# Patient Record
Sex: Female | Born: 2005 | Race: Black or African American | Hispanic: No | Marital: Single | Smoking: Never smoker
Health system: Southern US, Community
[De-identification: ages and names within clinical notes are randomized; demographics above are authoritative.]

## PROBLEM LIST (undated history)

## (undated) DIAGNOSIS — J45909 Unspecified asthma, uncomplicated: Secondary | ICD-10-CM

## (undated) DIAGNOSIS — Z87898 Personal history of other specified conditions: Secondary | ICD-10-CM

## (undated) DIAGNOSIS — Z820 Family history of epilepsy and other diseases of the nervous system: Secondary | ICD-10-CM

## (undated) HISTORY — DX: Family history of epilepsy and other diseases of the nervous system: Z82.0

## (undated) HISTORY — DX: Unspecified asthma, uncomplicated: J45.909

## (undated) HISTORY — DX: Personal history of other specified conditions: Z87.898

---
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
## 2023-04-21 NOTE — Progress Notes (Deleted)
NEUROLOGY CONSULTATION NOTE  Olivia Steele MRN: [id number] DOB: [birth year]  Referring provider: Elsie Saas, MD Primary care provider: [facility]  Reason for consult:  headache  Assessment/Plan:   ***   Subjective:  Olivia Steele is a 17 year old ***-handed female with asthma, allergic rhinitis and ADHD who presents for headaches.  History supplemented by referring provider's note.  Onset:  *** Location:  *** Quality:  *** Intensity:  ***.  *** denies new headache, thunderclap headache or severe headache that wakes *** from sleep. Aura:  *** Prodrome:  *** Postdrome:  *** Associated symptoms:  ***.  *** denies associated unilateral numbness or weakness. Duration:  *** Frequency:  *** Frequency of abortive medication: *** Triggers:  *** Relieving factors:  *** Activity:  ***  Past NSAIDS/analgesics:  *** Past abortive triptans:  *** Past abortive ergotamine:  *** Past muscle relaxants:  *** Past anti-emetic:  *** Past antihypertensive medications:  *** Past antidepressant medications:  *** Past anticonvulsant medications:  *** Past anti-CGRP:  *** Past vitamins/Herbal/Supplements:  *** Past antihistamines/decongestants:  *** Other past therapies:  ***  Current NSAIDS/analgesics:  *** Current triptans:  *** Current ergotamine:  *** Current anti-emetic:  *** Current muscle relaxants:  *** Current Antihypertensive medications:  *** Current Antidepressant medications:  *** Current Anticonvulsant medications:  *** Current anti-CGRP:  *** Current Vitamins/Herbal/Supplements:  *** Current Antihistamines/Decongestants:  levocetirizine, Flonase Other therapy:  *** Birth control:  *** Other medications:  ***   Caffeine:  *** Alcohol:  *** Smoker:  *** Diet:  *** Exercise:  *** Depression:  ***; Anxiety:  *** Other pain:  *** Sleep hygiene:  ***  Past history:  She did fall and hit her head when she was [age] years old in January 2007.   CT head at that time, personally reviewed, was normal.  Family history of headache:  ***      PAST MEDICAL HISTORY: Past Medical History:  Diagnosis Date   Asthma     PAST SURGICAL HISTORY: No past surgical history on file.  MEDICATIONS: Current Outpatient Medications on File Prior to Visit  Medication Sig Dispense Refill   norgestimate-ethinyl estradiol (ORTHO-CYCLEN) 0.25-35 MG-MCG tablet Take 1 tablet by mouth daily. 1 Package 11   No current facility-administered medications on file prior to visit.    ALLERGIES: No Known Allergies  FAMILY HISTORY: Family History  Problem Relation Age of Onset   Diabetes Mother    Diabetes Maternal Grandmother     Objective:  *** General: No acute distress.  Patient appears well-groomed.   Head:  Normocephalic/atraumatic Eyes:  fundi examined but not visualized Neck: supple, no paraspinal tenderness, full range of motion Back: No paraspinal tenderness Heart: regular rate and rhythm Lungs: Clear to auscultation bilaterally. Vascular: No carotid bruits. Neurological Exam: Mental status: alert and oriented to person, place, and time, speech fluent and not dysarthric, language intact. Cranial nerves: CN I: not tested CN II: pupils equal, round and reactive to light, visual fields intact CN III, IV, VI:  full range of motion, no nystagmus, no ptosis CN V: facial sensation intact. CN VII: upper and lower face symmetric CN VIII: hearing intact CN IX, X: gag intact, uvula midline CN XI: sternocleidomastoid and trapezius muscles intact CN XII: tongue midline Bulk & Tone: normal, no fasciculations. Motor:  muscle strength 5/5 throughout Sensation:  Pinprick, temperature and vibratory sensation intact. Deep Tendon Reflexes:  2+ throughout,  toes downgoing.   Finger to nose testing:  Without dysmetria.   Heel to shin:  Without dysmetria.   Gait:  Normal station and stride.  Romberg negative.    Thank you for allowing me to take  part in the care of this patient.  Shon Millet, DO  CC: ***

## 2023-04-24 ENCOUNTER — Ambulatory Visit: Payer: Medicaid Other | Admitting: Neurology

## 2023-05-16 ENCOUNTER — Encounter: Payer: Self-pay | Admitting: Neurology

## 2023-07-10 ENCOUNTER — Emergency Department (HOSPITAL_BASED_OUTPATIENT_CLINIC_OR_DEPARTMENT_OTHER)
Admission: EM | Admit: 2023-07-10 | Discharge: 2023-07-10 | Discharge status: Against Medical Advice | Payer: Medicaid Other

## 2023-07-10 DIAGNOSIS — Z20828 Contact with and (suspected) exposure to other viral communicable diseases: Secondary | ICD-10-CM | POA: Diagnosis not present

## 2023-07-10 DIAGNOSIS — J101 Influenza due to other identified influenza virus with other respiratory manifestations: Secondary | ICD-10-CM | POA: Diagnosis not present

## 2023-07-10 DIAGNOSIS — Z20822 Contact with and (suspected) exposure to covid-19: Secondary | ICD-10-CM | POA: Diagnosis not present

## 2023-07-10 NOTE — ED Notes (Signed)
 Pt advised Registration she was leaving

## 2023-08-29 NOTE — Progress Notes (Unsigned)
 NEUROLOGY CONSULTATION NOTE  Olivia Steele MRN: [id number] DOB: 2005/11/24  Referring provider: [facility] Primary care provider: No PCP  Reason for consult:  headache  Assessment/Plan:   Chronic migraine without aura, without status migrainosus, not intractable  Due to having near daily headaches for 1-2 years, check MRI of brain with and without contrast Migraine prevention:  Start topiramate 25mg  at bedtime.  We can increase dose to 50mg  in 4 weeks if needed.  Discussed side effects and advised to take precautions not to get pregnant. Migraine rescue:  Rizatriptan 10mg ; Zofran 4mg .  Stop ibuprofen Limit use of pain relievers to no more than 2 days out of week to prevent risk of rebound or medication-overuse headache. Keep headache diary Consider magnesium citrate, CoQ10, riboflavin Lifestyle modification discussed Follow up 6 months.    Subjective:  Olivia Steele is a 18 year old right-handed female with ADHD and asthma who presents for headaches.  History supplemented by her accompanying mother and referring provider's note.  Onset:  childhood.  Worse since 17 years old, almost daily since 18 years old Location:  diffuse Quality:  pounding Intensity:  9-10/10 Aura:  absent Prodrome:  absent Associated symptoms:  Nausea, photophobia, phonophobia, osmophobia, blurred vision (sometimes sees lights), dizziness, neck pain, weakness.   Duration:  all day Frequency:  20 days a month Frequency of abortive medication: ibuprofen 2-3 times a week Triggers:  unknown Relieving factors:  lay down and rest Activity:  aggravated, able to still function  Past NSAIDS/analgesics:  none Past abortive triptans:  none Past abortive ergotamine:  none Past muscle relaxants:  none Past anti-emetic:  none Past antihypertensive medications:  none Past antidepressant medications:  none Past anticonvulsant medications:  none Past anti-CGRP:  none Past  vitamins/Herbal/Supplements:  Xyzal Past antihistamines/decongestants:   Other past therapies:  none  Current NSAIDS/analgesics:  ibuprofen Current triptans:  none Current ergotamine:  none Current anti-emetic:  none Current muscle relaxants:  none Current Antihypertensive medications:  none Current Antidepressant medications:  none Current Anticonvulsant medications:  none Current anti-CGRP:  one Current Vitamins/Herbal/Supplements:  none Current Antihistamines/Decongestants:  none Other therapy:  none Birth control:  Ortho-Cyclen    Caffeine:  No coffee, soda, energy drink Diet:  five-six 16 oz bottles of water.  Vegetables, fruit.  Avoids pork and hamburgers Exercise:  walks Depression:  no; Anxiety:  no Other pain:  right ear pain Sleep hygiene:  8 hours a night.  Patient fell and hit her head when she was 18 years old on 06/13/2005.  CT head at that time, personally reviewed, was negative.  Mother doesn't remember this event.   Family history of headache:  mom (migraines) Other family history:  mom (Chiari malformation s/p decompression)      PAST MEDICAL HISTORY: Past Medical History:  Diagnosis Date   Asthma     PAST SURGICAL HISTORY: No past surgical history on file.  MEDICATIONS: Current Outpatient Medications on File Prior to Visit  Medication Sig Dispense Refill   norgestimate-ethinyl estradiol (ORTHO-CYCLEN) 0.25-35 MG-MCG tablet Take 1 tablet by mouth daily. 1 Package 11   No current facility-administered medications on file prior to visit.    ALLERGIES: No Known Allergies  FAMILY HISTORY: Family History  Problem Relation Age of Onset   Diabetes Mother    Diabetes Maternal Grandmother     Objective:  Blood pressure 107/61, pulse 85, height 4\' 11"  (1.499 m), weight 179 lb (81.2 kg), SpO2 100%. General: No acute distress.  Patient appears well-groomed.   Head:  Normocephalic/atraumatic Eyes:  fundi examined but not visualized Neck: supple, no  paraspinal tenderness, full range of motion Heart: regular rate and rhythm Neurological Exam: Mental status: alert and oriented to person, place, and time, speech fluent and not dysarthric, language intact. Cranial nerves: CN I: not tested CN II: pupils equal, round and reactive to light, visual fields intact CN III, IV, VI:  full range of motion, no nystagmus, no ptosis CN V: facial sensation intact. CN VII: upper and lower face symmetric CN VIII: hearing intact CN IX, X: gag intact, uvula midline CN XI: sternocleidomastoid and trapezius muscles intact CN XII: tongue midline Bulk & Tone: normal, no fasciculations. Motor:  muscle strength 5/5 throughout Sensation:  Pinprick and vibratory sensation intact. Deep Tendon Reflexes:  2+ throughout,  toes downgoing.   Finger to nose testing:  Without dysmetria.   Heel to shin:  Without dysmetria.   Gait:  Normal station and stride.  Romberg negative.    Thank you for allowing me to take part in the care of this patient.  Shon Millet, DO

## 2023-08-30 ENCOUNTER — Ambulatory Visit (INDEPENDENT_AMBULATORY_CARE_PROVIDER_SITE_OTHER): Payer: Medicaid Other | Admitting: Neurology

## 2023-08-30 ENCOUNTER — Encounter: Payer: Self-pay | Admitting: Neurology

## 2023-08-30 VITALS — BP 107/61 | HR 85 | Ht 59.0 in | Wt 179.0 lb

## 2023-08-30 DIAGNOSIS — G43709 Chronic migraine without aura, not intractable, without status migrainosus: Secondary | ICD-10-CM

## 2023-08-30 MED ORDER — TOPIRAMATE 25 MG PO TABS: 25.0000 mg | ORAL_TABLET | Freq: Every day | ORAL | 5 refills | Status: DC

## 2023-08-30 MED ORDER — ONDANSETRON HCL 4 MG PO TABS: 4.0000 mg | ORAL_TABLET | Freq: Three times a day (TID) | ORAL | 5 refills | Status: DC | PRN

## 2023-08-30 MED ORDER — RIZATRIPTAN BENZOATE 10 MG PO TABS: 10.0000 mg | ORAL_TABLET | ORAL | 5 refills | Status: DC | PRN

## 2023-08-30 NOTE — Patient Instructions (Addendum)
 Help finding a primary care provider within [place]. If you do not have access to a computer or the Internet you can call [phone] and  they will help you scheduled with a provider.  Or you can go to: [email or web address]      MRI of brain with and without contrast. Start topiramate 25mg  at bedtime.  Contact us in 4 weeks with update and we can increase dose if needed.  Check CBC and CMP STOP IBUPROFEN.  Take rizatriptan 10mg  at earliest onset of headache.  May repeat dose once in 2 hours if needed.  Maximum 2 tablets in 24 hours. Take ondansetron for nausea. Limit use of pain relievers to no more than 2 days out of the week.  These medications include acetaminophen, NSAIDs (ibuprofen/Advil/Motrin, naproxen/Aleve, triptans (Imitrex/sumatriptan), Excedrin, and narcotics.  This will help reduce risk of rebound headaches. Be aware of common food triggers Routine exercise Stay adequately hydrated (aim for 64 oz water daily) Keep headache diary Maintain proper stress management Maintain proper sleep hygiene Do not skip meals Consider supplements:  magnesium citrate 400mg  daily, riboflavin 400mg  daily, coenzyme Q10 300mg  daily

## 2023-08-31 ENCOUNTER — Ambulatory Visit: Payer: Medicaid Other | Admitting: Neurology

## 2023-09-29 ENCOUNTER — Encounter: Payer: Self-pay | Admitting: Neurology

## 2023-10-02 ENCOUNTER — Ambulatory Visit
Admission: RE | Admit: 2023-10-02 | Discharge: 2023-10-02 | Disposition: A | Discharge status: Home / Self Care | Source: Ambulatory Visit | Attending: Neurology | Admitting: Neurology

## 2023-10-02 DIAGNOSIS — G43709 Chronic migraine without aura, not intractable, without status migrainosus: Secondary | ICD-10-CM | POA: Diagnosis not present

## 2023-10-02 MED ORDER — GADOPICLENOL 0.5 MMOL/ML IV SOLN
8.0000 mL | Freq: Once | INTRAVENOUS | Status: AC | PRN
  Administered 2023-10-02: 8 mL via INTRAVENOUS

## 2023-10-10 ENCOUNTER — Telehealth: Payer: Self-pay | Admitting: Neurology

## 2023-10-10 DIAGNOSIS — G935 Compression of brain: Secondary | ICD-10-CM

## 2023-10-10 NOTE — Telephone Encounter (Signed)
 Pt. Cld bck for results discussion

## 2023-10-11 DIAGNOSIS — Z8639 Personal history of other endocrine, nutritional and metabolic disease: Secondary | ICD-10-CM | POA: Diagnosis not present

## 2023-10-11 DIAGNOSIS — Z0000 Encounter for general adult medical examination without abnormal findings: Secondary | ICD-10-CM | POA: Diagnosis not present

## 2023-10-11 DIAGNOSIS — R42 Dizziness and giddiness: Secondary | ICD-10-CM | POA: Diagnosis not present

## 2023-10-11 NOTE — Telephone Encounter (Signed)
 Per Dr.Jaffe, I called and spoke with Olivia Steele and explained that the MRI does show a Chiari malformation.  Unclear if her headaches are related but would like to refer to neurosurgery for their opinion regarding management.    Referral sent

## 2024-03-12 NOTE — Progress Notes (Unsigned)
 NEUROLOGY FOLLOW UP OFFICE NOTE  Olivia Steele [id number]  Assessment/Plan:   Chronic migraine without aura, without status migrainosus, not intractable Chiari I Malformation   Migraine prevention:  Topiramate  25mg  at bedtime *** Migraine rescue:  Rizatriptan  10mg ; Zofran  4mg .  *** Limit use of pain relievers to no more than 2 days out of week to prevent risk of rebound or medication-overuse headache. Keep headache diary Consider magnesium citrate, CoQ10, riboflavin Lifestyle modification discussed Follow up 6 months.    Subjective:  Olivia Steele is a 18 year old right-handed female with ADHD and asthma who follows up for migraines.  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  Referred to neurosurgery for their opinion ***  Started patient on topiramate . Intensity:  *** Duration:  *** with rizatriptan  Frequency:  *** Frequency of abortive medication: *** Current NSAIDS/analgesics: none Current triptans:  rizatriptan  10mg  Current ergotamine:  none Current anti-emetic:  Zofran  4mg  Current muscle relaxants:  none Current Antihypertensive medications:  none Current Antidepressant medications:  none Current Anticonvulsant medications:  topiramate  25mg  at bedtime Current anti-CGRP:  one Current Vitamins/Herbal/Supplements:  none Current Antihistamines/Decongestants:  none Other therapy:  none Birth control:  Ortho-Cyclen    Caffeine:  No coffee, soda, energy drink Diet:  five-six 16 oz bottles of water.  Vegetables, fruit.  Avoids pork and hamburgers Exercise:  walks Depression:  no; Anxiety:  no Other pain:  right ear pain Sleep hygiene:  8 hours a night.   HISTORY: Onset:  childhood.  Worse since 18 years old, almost daily since 18 years old Location:  diffuse Quality:  pounding Intensity:  9-10/10 Aura:  absent Prodrome:   absent Associated symptoms:  Nausea, photophobia, phonophobia, osmophobia, blurred vision (sometimes sees lights), dizziness, neck pain, weakness.   Duration:  all day Frequency:  20 days a month Frequency of abortive medication: ibuprofen 2-3 times a week Triggers:  unknown Relieving factors:  lay down and rest Activity:  aggravated, able to still function  Past NSAIDS/analgesics:  ibuprofen Past abortive triptans:  none Past abortive ergotamine:  none Past muscle relaxants:  none Past anti-emetic:  none Past antihypertensive medications:  none Past antidepressant medications:  none Past anticonvulsant medications:  none Past anti-CGRP:  none Past vitamins/Herbal/Supplements:  Xyzal Past antihistamines/decongestants:   Other past therapies:  none   Patient fell and hit her head when she was 18 years old on 06/13/2005.  CT head at that time, personally reviewed, was negative.  Mother doesn't remember this event.   Family history of headache:  mom (migraines) Other family history:  mom (Chiari malformation s/p decompression)  PAST MEDICAL HISTORY: Past Medical History:  Diagnosis Date   Asthma    Family history of migraine    History of headache     MEDICATIONS: Current Outpatient Medications on File Prior to Visit  Medication Sig Dispense Refill   norgestimate -ethinyl estradiol  (ORTHO-CYCLEN) 0.25-35 MG-MCG tablet Take 1 tablet by mouth daily. 1 Package 11   ondansetron  (ZOFRAN ) 4 MG tablet Take 1 tablet (4 mg total) by mouth every 8 (eight) hours as needed. 20 tablet 5   rizatriptan  (MAXALT ) 10 MG tablet Take 1 tablet (10 mg total) by mouth as needed for migraine. May repeat in 2 hours if needed.  Maximum 2 tablets in 24 hours. 10 tablet 5   topiramate  (TOPAMAX ) 25 MG tablet Take 1 tablet (25 mg total) by  mouth at bedtime. 30 tablet 5   No current facility-administered medications on file prior to visit.    ALLERGIES: No Known Allergies  FAMILY HISTORY: Family History   Problem Relation Age of Onset   Stroke Mother    Migraines Mother    Diabetes Mother    Chiari malformation Mother    Migraines Sister    Migraines Sister    Migraines Brother    Migraines Brother    Diabetes Maternal Grandmother       Objective:  *** General: No acute distress.  Patient appears ***-groomed.   Head:  Normocephalic/atraumatic Eyes:  Fundi examined but not visualized Neck: supple, no paraspinal tenderness, full range of motion Heart:  Regular rate and rhythm Neurological Exam: alert and oriented.  Speech fluent and not dysarthric, language intact.  CN II-XII intact. Bulk and tone normal, muscle strength 5/5 throughout.  Sensation to light touch intact.  Deep tendon reflexes 2+ throughout, toes downgoing.  Finger to nose testing intact.  Gait normal, Romberg negative.   Juliene Dunnings, DO  CC: ***

## 2024-03-13 ENCOUNTER — Encounter: Payer: Self-pay | Admitting: Neurology

## 2024-03-13 ENCOUNTER — Ambulatory Visit (INDEPENDENT_AMBULATORY_CARE_PROVIDER_SITE_OTHER): Admitting: Neurology

## 2024-03-13 VITALS — BP 117/78 | HR 87 | Ht 59.0 in | Wt 180.0 lb

## 2024-03-13 DIAGNOSIS — G43009 Migraine without aura, not intractable, without status migrainosus: Secondary | ICD-10-CM | POA: Diagnosis not present

## 2024-03-13 DIAGNOSIS — G935 Compression of brain: Secondary | ICD-10-CM

## 2024-03-13 MED ORDER — RIZATRIPTAN BENZOATE 10 MG PO TABS: 10.0000 mg | ORAL_TABLET | ORAL | 5 refills | Status: AC | PRN

## 2024-03-13 MED ORDER — ONDANSETRON HCL 4 MG PO TABS: 4.0000 mg | ORAL_TABLET | Freq: Three times a day (TID) | ORAL | 5 refills | Status: AC | PRN

## 2024-03-13 MED ORDER — TOPIRAMATE 50 MG PO TABS: 50.0000 mg | ORAL_TABLET | Freq: Every day | ORAL | 5 refills | Status: AC

## 2024-03-26 DIAGNOSIS — R0789 Other chest pain: Secondary | ICD-10-CM | POA: Diagnosis not present

## 2024-03-26 DIAGNOSIS — Z23 Encounter for immunization: Secondary | ICD-10-CM | POA: Diagnosis not present

## 2024-10-02 ENCOUNTER — Ambulatory Visit: Admitting: Neurology
